# Patient Record
Sex: Male | Born: 2000 | Race: Black or African American | Hispanic: No | Marital: Single | Smoking: Never smoker
Health system: Southern US, Community
[De-identification: ages and names within clinical notes are randomized; demographics above are authoritative.]

## PROBLEM LIST (undated history)

## (undated) HISTORY — PX: OTHER SURGICAL HISTORY: SHX169

---

## 2020-12-17 ENCOUNTER — Emergency Department (HOSPITAL_COMMUNITY)
Admission: EM | Admit: 2020-12-17 | Discharge: 2020-12-17 | Disposition: A | Discharge status: Home / Self Care | Payer: Medicaid - Out of State | Attending: Emergency Medicine | Admitting: Emergency Medicine

## 2020-12-17 ENCOUNTER — Other Ambulatory Visit: Payer: Self-pay

## 2020-12-17 ENCOUNTER — Emergency Department (HOSPITAL_COMMUNITY): Payer: Medicaid - Out of State

## 2020-12-17 ENCOUNTER — Encounter (HOSPITAL_COMMUNITY): Payer: Self-pay | Admitting: *Deleted

## 2020-12-17 DIAGNOSIS — Y92410 Unspecified street and highway as the place of occurrence of the external cause: Secondary | ICD-10-CM | POA: Insufficient documentation

## 2020-12-17 DIAGNOSIS — M79672 Pain in left foot: Secondary | ICD-10-CM | POA: Diagnosis not present
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
## 2020-12-17 NOTE — Discharge Instructions (Addendum)
You have been seen here for left foot pain, I placed you in a postop shoe please wear during the day you may take off at nighttime.  I recommend taking over-the-counter pain medications like ibuprofen and/or Tylenol every 6 as needed.  Please follow dosage and on the back of bottle.  I also recommend applying heat to the area and stretching out the muscles as this will help decrease stiffness and pain.   If pain persists after 1 to 2 weeks please follow-up with orthopedic surgery for further evaluation.  Come back to the emergency department if you develop chest pain, shortness of breath, severe abdominal pain, uncontrolled nausea, vomiting, diarrhea.
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
## 2022-06-28 IMAGING — DX DG FOOT COMPLETE 3+V*L*
3 series · 3 of 3 positions shown · non-contrast
Comparison: None

CLINICAL DATA: Injury to LEFT foot.  History of prior surgery.

EXAM:
LEFT FOOT - COMPLETE 3+ VIEW

[foot ap]
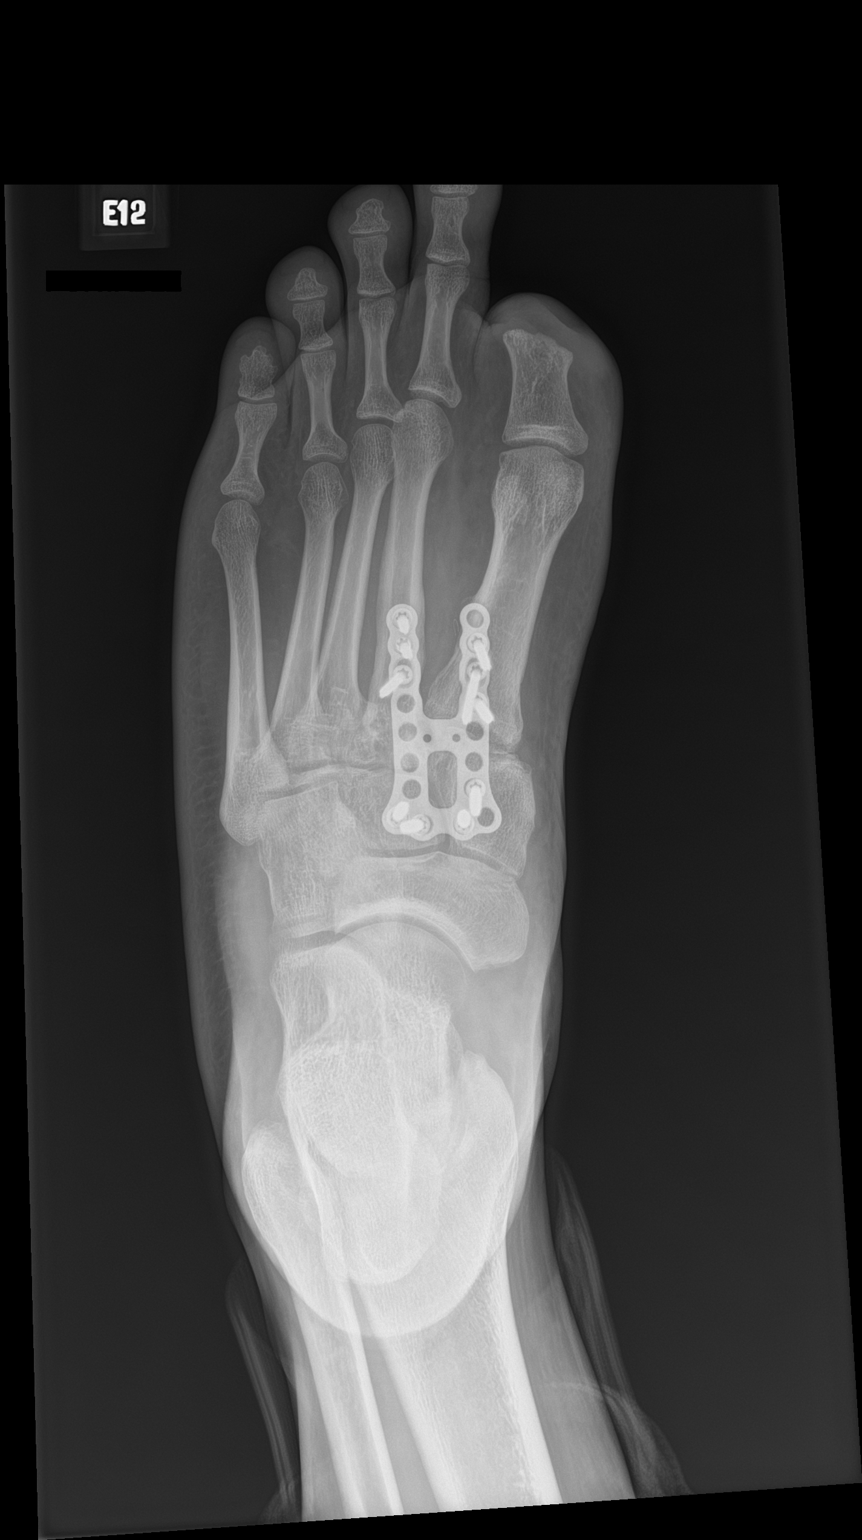

[foot obl]
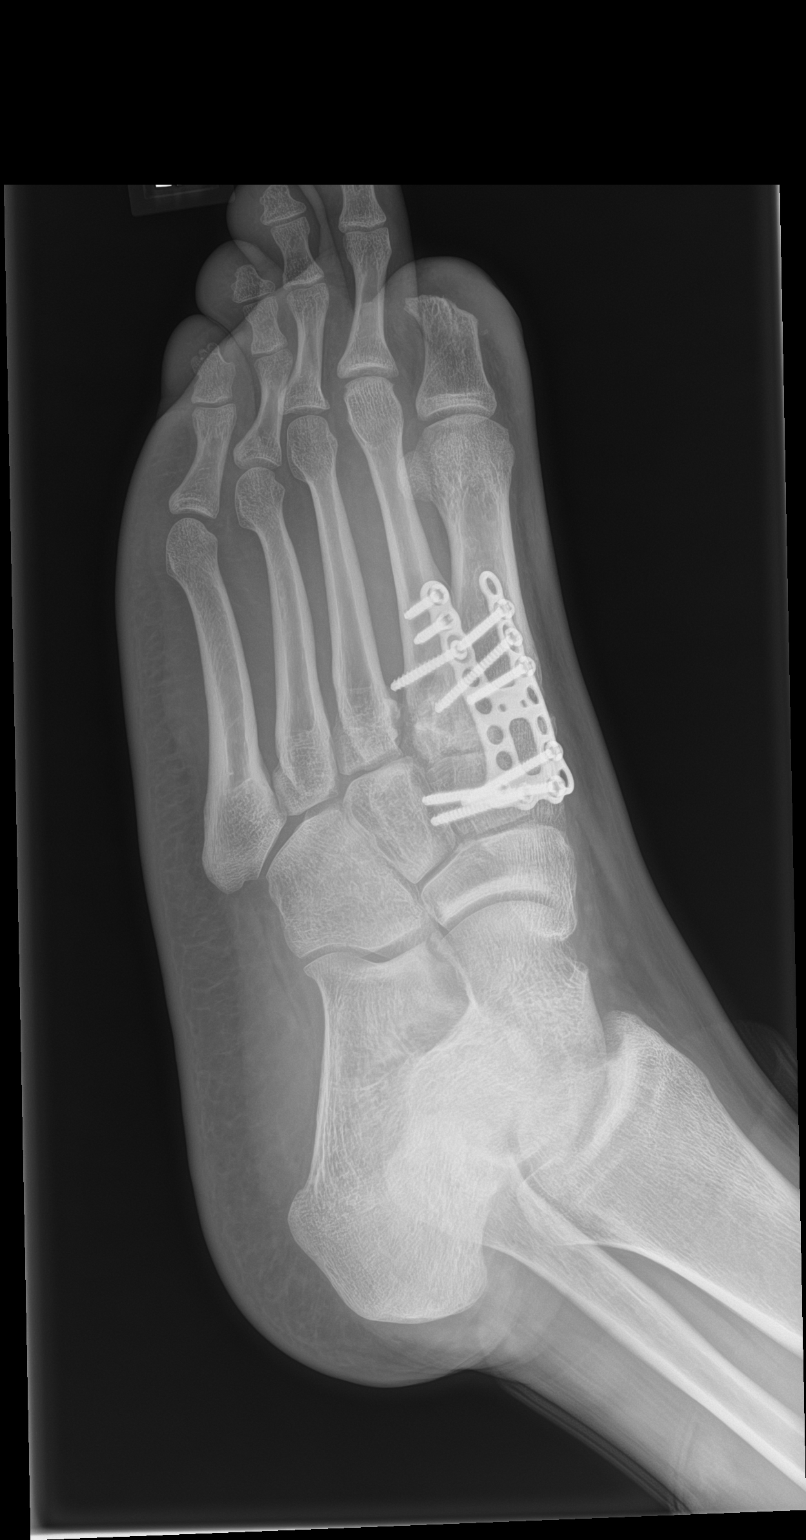

[foot lat]
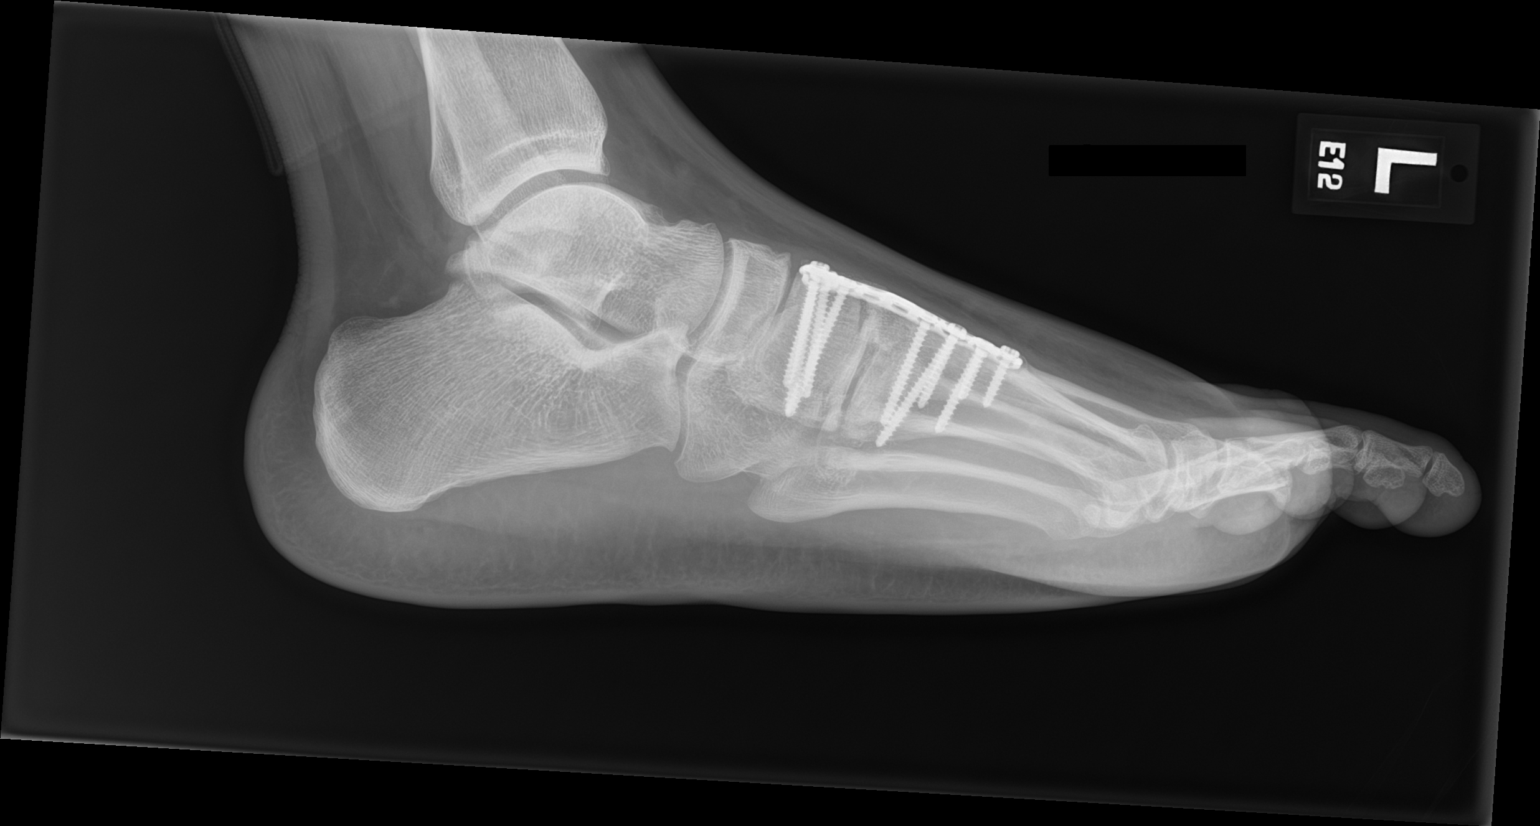

[3 of 3 positions shown; findings below may reference images not displayed]

FINDINGS: Previous fusion across the first and second tarsal metatarsal joints
as well as across the Lisfranc joint.

Hardware appears intact.

Signs of previous amputation of the great toe.

Mild soft tissue swelling about the distal aspect of the stump.

No signs of fracture or acute bony abnormality.
IMPRESSION: Signs of previous partial amputation of the great toe with mild soft
tissue swelling and ORIF of the midfoot as described. No acute bone
or joint abnormality.

## 2022-08-08 ENCOUNTER — Emergency Department (HOSPITAL_COMMUNITY): Payer: Medicaid - Out of State

## 2022-08-08 ENCOUNTER — Emergency Department (HOSPITAL_COMMUNITY)
Admission: EM | Admit: 2022-08-08 | Discharge: 2022-08-08 | Disposition: A | Discharge status: Home / Self Care | Payer: Self-pay | Attending: Student | Admitting: Student

## 2022-08-08 ENCOUNTER — Encounter (HOSPITAL_COMMUNITY): Payer: Self-pay | Admitting: *Deleted

## 2022-08-08 ENCOUNTER — Other Ambulatory Visit: Payer: Self-pay

## 2022-08-08 DIAGNOSIS — X501XXA Overexertion from prolonged static or awkward postures, initial encounter: Secondary | ICD-10-CM | POA: Insufficient documentation

## 2022-08-08 DIAGNOSIS — Y9367 Activity, basketball: Secondary | ICD-10-CM | POA: Insufficient documentation

## 2022-08-08 DIAGNOSIS — S93401A Sprain of unspecified ligament of right ankle, initial encounter: Secondary | ICD-10-CM | POA: Insufficient documentation

## 2022-08-08 MED ORDER — NAPROXEN 375 MG PO TABS: 375.0000 mg | ORAL_TABLET | Freq: Two times a day (BID) | ORAL | 0 refills | Status: DC

## 2022-08-08 MED ORDER — NAPROXEN 250 MG PO TABS
500.0000 mg | ORAL_TABLET | Freq: Once | ORAL | Status: AC
  Administered 2022-08-08: 500 mg via ORAL
  Filled 2022-08-08: qty 2

## 2022-08-08 NOTE — ED Provider Notes (Signed)
[place] Provider Note  CSN: [id number] Arrival date & time: 08/08/22 [id number]  Chief Complaint(s) Ankle Pain  HPI Tyaire Bodine is a 22 y.o. male who presents emergency room for evaluation of right ankle pain.  Patient states that he was playing basketball yesterday and twisted his ankle on the right.  He states that he has pain behind the knee and worse at bilateral malleoli.  Difficulty bearing weight secondary to pain.  Denies numbness, tingling, weakness or other systemic or traumatic complaints.   Past Medical History History reviewed. No pertinent past medical history. There are no problems to display for this patient.  Home Medication(s) Prior to Admission medications   Not on File                                                                                                                                    Past Surgical History Past Surgical History:  Procedure Laterality Date   left foot surgery     Family History History reviewed. No pertinent family history.  Social History Social History   Tobacco Use   Smoking status: Never   Smokeless tobacco: Never  Substance Use Topics   Alcohol use: Never   Drug use: Never   Allergies Penicillins and Vancomycin  Review of Systems Review of Systems  Musculoskeletal:  Positive for arthralgias, joint swelling and myalgias.    Physical Exam Vital Signs  I have reviewed the triage vital signs BP 137/78 (BP Location: Left Arm)   Pulse (!) 56   Temp 98.2 F (36.8 C) (Oral)   Resp 16   Ht 5' 7"$  (1.702 m)   Wt 95.3 kg   SpO2 99%   BMI 32.89 kg/m   Physical Exam Constitutional:      General: He is not in acute distress.    Appearance: Normal appearance.  HENT:     Head: Normocephalic and atraumatic.     Nose: No congestion or rhinorrhea.  Eyes:     General:        Right eye: No discharge.        Left eye: No discharge.     Extraocular Movements: Extraocular  movements intact.     Pupils: Pupils are equal, round, and reactive to light.  Cardiovascular:     Rate and Rhythm: Normal rate and regular rhythm.     Heart sounds: No murmur heard. Pulmonary:     Effort: No respiratory distress.     Breath sounds: No wheezing or rales.  Abdominal:     General: There is no distension.     Tenderness: There is no abdominal tenderness.  Musculoskeletal:        General: Swelling and tenderness present. Normal range of motion.     Cervical back: Normal range of motion.  Skin:    General: Skin is warm and dry.  Neurological:  General: No focal deficit present.     Mental Status: He is alert.     ED Results and Treatments Labs (all labs ordered are listed, but only abnormal results are displayed) Labs Reviewed - No data to display                                                                                                                        Radiology No results found.  Pertinent labs & imaging results that were available during my care of the patient were reviewed by me and considered in my medical decision making (see MDM for details).  Medications Ordered in ED Medications - No data to display                                                                                                                                   Procedures Procedures  (including critical care time)  Medical Decision Making / ED Course   This patient presents to the ED for concern of ankle pain, this involves an extensive number of treatment options, and is a complaint that carries with it a high risk of complications and morbidity.  The differential diagnosis includes fracture, sprain, contusion, hematoma, Maisonneuve injury  MDM: Patient seen emergency room for evaluation of ankle pain.  Physical exam with swelling and tenderness to the lateral malleolus and mild tenderness on the lateral proximal fibula.  X-ray imaging with evidence of ankle sprain with  possible ligamentous injury.  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  He does not have tenderness in this area.  He was placed in a Aircast and given crutches as well as NSAID therapy.  He was instructed to follow-up outpatient with orthopedics and at this time patient does not meet inpatient criteria for admission and he is safe for discharge with outpatient follow-up.   Additional history obtained:  -External records from outside source obtained and reviewed including: Chart review including previous notes, labs, imaging, consultation notes   Imaging Studies ordered: I ordered imaging studies including x-ray ankle, x-ray tib-fib I independently visualized and interpreted imaging. I agree with the radiologist interpretation   Medicines ordered and prescription drug management: No orders of the defined types were placed in this encounter.   -I have reviewed the patients home medicines and have made adjustments as needed  Critical interventions none    Cardiac Monitoring: The patient was maintained on a cardiac monitor.  I personally viewed and interpreted the cardiac monitored which showed an underlying rhythm of: NSR  Social Determinants of Health:  Factors impacting patients care include: none   Reevaluation: After the interventions noted above, I reevaluated the patient and found that they have :improved  Co morbidities that complicate the patient evaluation History reviewed. No pertinent past medical history.    Dispostion: I considered admission for this patient, but he does not meet inpatient criteria for admission he is safe for discharge with outpatient follow-up     Final Clinical Impression(s) / ED Diagnoses Final diagnoses:  None     @PCDICTATION$ @    Mikhayla Phillis, Debe Coder, MD 08/08/22 1056

## 2022-08-08 NOTE — ED Triage Notes (Signed)
Pt c/o pain and swelling to right ankle after twisting his ankle while playing basketball yesterday;  neurovascular intact

## 2022-11-13 ENCOUNTER — Emergency Department (HOSPITAL_COMMUNITY): Payer: Self-pay

## 2022-11-13 ENCOUNTER — Emergency Department (HOSPITAL_COMMUNITY)
Admission: EM | Admit: 2022-11-13 | Discharge: 2022-11-13 | Disposition: A | Discharge status: Home / Self Care | Payer: Self-pay | Attending: Emergency Medicine | Admitting: Emergency Medicine

## 2022-11-13 ENCOUNTER — Other Ambulatory Visit: Payer: Self-pay

## 2022-11-13 ENCOUNTER — Encounter (HOSPITAL_COMMUNITY): Payer: Self-pay | Admitting: Emergency Medicine

## 2022-11-13 DIAGNOSIS — G43909 Migraine, unspecified, not intractable, without status migrainosus: Secondary | ICD-10-CM | POA: Insufficient documentation

## 2022-11-13 DIAGNOSIS — Z20822 Contact with and (suspected) exposure to covid-19: Secondary | ICD-10-CM | POA: Insufficient documentation

## 2022-11-13 LAB — CBC WITH DIFFERENTIAL/PLATELET
Abs Immature Granulocytes: 0.01 10*3/uL (ref 0.00–0.07)
Basophils Absolute: 0.1 10*3/uL (ref 0.0–0.1)
Basophils Relative: 1 %
Eosinophils Absolute: 0.4 10*3/uL (ref 0.0–0.5)
Eosinophils Relative: 4 %
HCT: 48 % (ref 39.0–52.0)
Hemoglobin: 16.6 g/dL (ref 13.0–17.0)
Immature Granulocytes: 0 %
Lymphocytes Relative: 26 %
Lymphs Abs: 2.5 10*3/uL (ref 0.7–4.0)
MCH: 31 pg (ref 26.0–34.0)
MCHC: 34.6 g/dL (ref 30.0–36.0)
MCV: 89.6 fL (ref 80.0–100.0)
Monocytes Absolute: 0.9 10*3/uL (ref 0.1–1.0)
Monocytes Relative: 9 %
Neutro Abs: 5.6 10*3/uL (ref 1.7–7.7)
Neutrophils Relative %: 60 %
Platelets: 283 10*3/uL (ref 150–400)
RBC: 5.36 MIL/uL (ref 4.22–5.81)
RDW: 11.9 % (ref 11.5–15.5)
WBC: 9.4 10*3/uL (ref 4.0–10.5)
nRBC: 0 % (ref 0.0–0.2)

## 2022-11-13 LAB — COMPREHENSIVE METABOLIC PANEL
ALT: 21 U/L (ref 0–44)
AST: 37 U/L (ref 15–41)
Albumin: 5 g/dL (ref 3.5–5.0)
Alkaline Phosphatase: 131 U/L — ABNORMAL HIGH (ref 38–126)
Anion gap: 9 (ref 5–15)
BUN: 14 mg/dL (ref 6–20)
CO2: 24 mmol/L (ref 22–32)
Calcium: 9.6 mg/dL (ref 8.9–10.3)
Chloride: 103 mmol/L (ref 98–111)
Creatinine, Ser: 1.14 mg/dL (ref 0.61–1.24)
GFR, Estimated: >60 mL/min (ref >60)
Glucose, Bld: 89 mg/dL (ref 70–99)
Potassium: 3.8 mmol/L (ref 3.5–5.1)
Sodium: 136 mmol/L (ref 135–145)
Total Bilirubin: 1.7 mg/dL — ABNORMAL HIGH (ref 0.3–1.2)
Total Protein: 8.6 g/dL — ABNORMAL HIGH (ref 6.5–8.1)

## 2022-11-13 LAB — SARS CORONAVIRUS 2 BY RT PCR: SARS Coronavirus 2 by RT PCR: NEGATIVE

## 2022-11-13 LAB — GROUP A STREP BY PCR: Group A Strep by PCR: NOT DETECTED

## 2022-11-13 MED ORDER — DIPHENHYDRAMINE HCL 50 MG/ML IJ SOLN
12.5000 mg | Freq: Once | INTRAMUSCULAR | Status: AC
  Administered 2022-11-13: 12.5 mg via INTRAVENOUS
  Filled 2022-11-13: qty 1

## 2022-11-13 MED ORDER — PROCHLORPERAZINE EDISYLATE 10 MG/2ML IJ SOLN
10.0000 mg | Freq: Once | INTRAMUSCULAR | Status: AC
  Administered 2022-11-13: 10 mg via INTRAVENOUS
  Filled 2022-11-13: qty 2

## 2022-11-13 MED ORDER — KETOROLAC TROMETHAMINE 15 MG/ML IJ SOLN
15.0000 mg | Freq: Once | INTRAMUSCULAR | Status: AC
  Administered 2022-11-13: 15 mg via INTRAVENOUS
  Filled 2022-11-13: qty 1

## 2022-11-13 MED ORDER — SODIUM CHLORIDE 0.9 % IV BOLUS
1000.0000 mL | Freq: Once | INTRAVENOUS | Status: AC
  Administered 2022-11-13: 1000 mL via INTRAVENOUS

## 2022-11-13 NOTE — ED Triage Notes (Signed)
Pt via POV c/o throbbing posterior headache since earlier this morning. No prior hx migraines. No other symptoms reported. No meds PTA

## 2022-11-13 NOTE — ED Notes (Signed)
Patient transported to CT 

## 2022-11-13 NOTE — Discharge Instructions (Signed)
You are seen in the emergency department for headache.  Thankfully workup and imaging was negative for any acute findings.  This is most likely migraine headache given that he had symptomatic improvement with a migraine cocktail.  I would encourage you to establish care with a primary care provider and provide information for [facility].  If you feel that your symptoms return, return to the emergency department for further treatment.  Otherwise advised that he can manage symptoms with over-the-counter interventions such as Tylenol, ibuprofen, Excedrin.

## 2022-11-13 NOTE — ED Provider Notes (Signed)
[place] EMERGENCY DEPARTMENT AT [facility] Provider Note   CSN: [id number] Arrival date & time: 11/13/22  1251     History Chief Complaint  Patient presents with   Headache    Roger Elliott is a 22 y.o. male.  Patient presents emergency department complaints of headaches.  He reports that he woke up this morning with a headache.  Does not really have headaches at baseline.  No prior history of significant migraines or headaches.  Does endorse some associated photosensitivity as well as mild nausea but denies any vomiting.   Headache      Home Medications Prior to Admission medications   Medication Sig Start Date End Date Taking? Authorizing Provider  naproxen (NAPROSYN) 375 MG tablet Take 1 tablet (375 mg total) by mouth 2 (two) times daily. 08/08/22   Kommor, Wyn Forster, MD      Allergies    Penicillins and Vancomycin    Review of Systems   Review of Systems  Neurological:  Positive for headaches.  All other systems reviewed and are negative.   Physical Exam Updated Vital Signs BP 116/83 (BP Location: Right Arm)   Pulse 84   Temp 98.7 F (37.1 C) (Oral)   Resp 16   Ht 5\' 7"  (1.702 m)   Wt 89.8 kg   SpO2 98%   BMI 31.01 kg/m  Physical Exam Vitals and nursing note reviewed.  Constitutional:      General: He is not in acute distress.    Appearance: He is well-developed.  HENT:     Head: Normocephalic and atraumatic.  Eyes:     Extraocular Movements: Extraocular movements intact.     Conjunctiva/sclera: Conjunctivae normal.     Pupils: Pupils are equal, round, and reactive to light.  Cardiovascular:     Rate and Rhythm: Normal rate and regular rhythm.     Heart sounds: No murmur heard. Pulmonary:     Effort: Pulmonary effort is normal. No respiratory distress.     Breath sounds: Normal breath sounds.  Abdominal:     Palpations: Abdomen is soft.     Tenderness: There is no abdominal tenderness.  Musculoskeletal:        General: No swelling.      Cervical back: Normal range of motion and neck supple. No rigidity.  Skin:    General: Skin is warm and dry.     Capillary Refill: Capillary refill takes less than 2 seconds.  Neurological:     Mental Status: He is alert.  Psychiatric:        Mood and Affect: Mood normal.     ED Results / Procedures / Treatments   Labs (all labs ordered are listed, but only abnormal results are displayed) Labs Reviewed  COMPREHENSIVE METABOLIC PANEL - Abnormal; Notable for the following components:      Result Value   Total Protein 8.6 (*)    Alkaline Phosphatase 131 (*)    Total Bilirubin 1.7 (*)    All other components within normal limits  GROUP A STREP BY PCR  SARS CORONAVIRUS 2 BY RT PCR  CBC WITH DIFFERENTIAL/PLATELET    EKG None  Radiology CT Head Wo Contrast  Result Date: 11/13/2022 CLINICAL DATA:  Headache, increasing frequency or severity. EXAM: CT HEAD WITHOUT CONTRAST TECHNIQUE: Contiguous axial images were obtained from the base of the skull through the vertex without intravenous contrast. RADIATION DOSE REDUCTION: This exam was performed according to the departmental dose-optimization program which includes automated exposure control, adjustment  of the mA and/or kV according to patient size and/or use of iterative reconstruction technique. COMPARISON:  None Available. FINDINGS: Brain: No acute intracranial hemorrhage. Gray-white differentiation is preserved. No hydrocephalus or extra-axial collection. No mass effect or midline shift. Vascular: No hyperdense vessel or unexpected calcification. Skull: No calvarial fracture or suspicious bone lesion. Skull base is unremarkable. Sinuses/Orbits: Unremarkable. Other: None. IMPRESSION: Normal head CT. Electronically Signed   By: Orvan Falconer M.D.   On: 11/13/2022 13:40    Procedures Procedures   Medications Ordered in ED Medications  sodium chloride 0.9 % bolus 1,000 mL (0 mLs Intravenous Stopped 11/13/22 1542)  prochlorperazine  (COMPAZINE) injection 10 mg (10 mg Intravenous Given 11/13/22 1411)  diphenhydrAMINE (BENADRYL) injection 12.5 mg (12.5 mg Intravenous Given 11/13/22 1411)  ketorolac (TORADOL) 15 MG/ML injection 15 mg (15 mg Intravenous Given 11/13/22 1412)    ED Course/ Medical Decision Making/ A&P                           Medical Decision Making Amount and/or Complexity of Data Reviewed Labs: ordered. Radiology: ordered.  Risk Prescription drug management.   This patient presents to the ED for concern of headache.  Differential diagnosis includes cluster headache, migraine headache, viral URI, strep pharyngitis   Lab Tests:  I Ordered, and personally interpreted labs.  The pertinent results include: CBC and CMP unremarkable, negative COVID, negative strep   Imaging Studies ordered:  I ordered imaging studies including CT head I independently visualized and interpreted imaging which showed no acute intracranial abnormalities I agree with the radiologist interpretation   Medicines ordered and prescription drug management:  I ordered medication including Compazine, Benadryl, Toradol, fluids for migraine cocktail Reevaluation of the patient after these medicines showed that the patient improved I have reviewed the patients home medicines and have made adjustments as needed   Problem List / ED Course:  Patient presents emergency department complaints of headaches.  Reports that he woke up this morning with a headache.  Typically does not have headaches at baseline no prior history of migraine headaches. States that he feels this headache is focused on the posterior aspect of his head without radiation but has a pulsatile characteristic. Also endorses some photosensitivity. All appears highly consistent with a migraine headache.  Lab and imaging workup initiated which was largely unremarkable without any acute abnormalities noted to account for patient's symptoms.  Will give patient migraine  cocktail and reassess. Position, Benadryl, Toradol and fluids were given to patient.  I reassessed the patient about 45 minutes later after medications were administered and patient reports resolution of his headache.  I this is highly likely this is a migraine headache that he has been experiencing.  Encourage patient to follow with primary care provider/primary care as he may benefit from further evaluation of headaches if symptoms were to return.  Patient is agreeable to treatment plan verbalized understanding all return precautions.  Final Clinical Impression(s) / ED Diagnoses Final diagnoses:  Migraine without status migrainosus, not intractable, unspecified migraine type    Rx / DC Orders ED Discharge Orders     None         Salomon Mast 11/14/22 1631    Bethann Berkshire, MD 11/23/22 1210

## 2023-10-13 ENCOUNTER — Encounter: Payer: Self-pay | Admitting: Emergency Medicine

## 2023-10-13 ENCOUNTER — Ambulatory Visit
Admission: EM | Admit: 2023-10-13 | Discharge: 2023-10-13 | Disposition: A | Discharge status: Home / Self Care | Payer: Self-pay | Attending: Family Medicine | Admitting: Family Medicine

## 2023-10-13 DIAGNOSIS — J069 Acute upper respiratory infection, unspecified: Secondary | ICD-10-CM | POA: Insufficient documentation

## 2023-10-13 DIAGNOSIS — Z113 Encounter for screening for infections with a predominantly sexual mode of transmission: Secondary | ICD-10-CM | POA: Insufficient documentation

## 2023-10-13 LAB — POC COVID19/FLU A&B COMBO
Covid Antigen, POC: NEGATIVE
Influenza A Antigen, POC: NEGATIVE
Influenza B Antigen, POC: NEGATIVE

## 2023-10-13 LAB — POCT RAPID STREP A (OFFICE): Rapid Strep A Screen: NEGATIVE

## 2023-10-13 MED ORDER — FLUTICASONE PROPIONATE 50 MCG/ACT NA SUSP: 1.0000 | Freq: Two times a day (BID) | NASAL | 2 refills | Status: DC

## 2023-10-13 MED ORDER — PROMETHAZINE-DM 6.25-15 MG/5ML PO SYRP: 5.0000 mL | ORAL_SOLUTION | Freq: Four times a day (QID) | ORAL | 0 refills | Status: DC | PRN

## 2023-10-13 NOTE — Discharge Instructions (Signed)
 I have sent in some medications to help with your cold symptoms and you may take DayQuil, NyQuil, ibuprofen and Tylenol as needed as well as using humidifiers, saline sinus rinses and warm teas.  Your COVID flu and strep test were all negative today.  We have also sent out screening for HIV, syphilis, gonorrhea, chlamydia and trichomonas and we will let you know if any of this comes back positive.

## 2023-10-13 NOTE — ED Triage Notes (Signed)
 Sore throat, headache, cough, nasal congestion x 2 days.  States feels weak sometimes.

## 2023-10-13 NOTE — ED Provider Notes (Signed)
 [facility]    CSN: [id number] Arrival date & time: 10/13/23  1649      History   Chief Complaint No chief complaint on file.   HPI Roger Elliott is a 23 y.o. male.   Patient presenting today with 2-day history of sore throat, headache, cough, congestion.  Denies fever, chills, chest pain, shortness of breath, abdominal pain, vomiting, diarrhea.  So far not trying anything over-the-counter for symptoms.  He is also requesting screening for STDs.  No known exposures or current symptoms.    History reviewed. No pertinent past medical history.  There are no active problems to display for this patient.   Past Surgical History:  Procedure Laterality Date   left foot surgery         Home Medications    Prior to Admission medications   Medication Sig Start Date End Date Taking? Authorizing Provider  fluticasone  (FLONASE ) 50 MCG/ACT nasal spray Place 1 spray into both nostrils 2 (two) times daily. 10/13/23  Yes Corbin Dess, PA-C  promethazine -dextromethorphan (PROMETHAZINE -DM) 6.25-15 MG/5ML syrup Take 5 mLs by mouth 4 (four) times daily as needed. 10/13/23  Yes Corbin Dess, PA-C    Family History History reviewed. No pertinent family history.  Social History Social History   Tobacco Use   Smoking status: Never   Smokeless tobacco: Never  Substance Use Topics   Alcohol use: Never   Drug use: Never     Allergies   Penicillins and Vancomycin   Review of Systems Review of Systems HPI  Physical Exam Triage Vital Signs ED Triage Vitals  Encounter Vitals Group     BP 10/13/23 1656 122/68     Systolic BP Percentile --      Diastolic BP Percentile --      Pulse Rate 10/13/23 1656 70     Resp 10/13/23 1656 18     Temp 10/13/23 1656 98.5 F (36.9 C)     Temp Source 10/13/23 1656 Oral     SpO2 10/13/23 1656 95 %     Weight --      Height --      Head Circumference --      Peak Flow --      Pain Score 10/13/23 1658 10      Pain Loc --      Pain Education --      Exclude from Growth Chart --    No data found.  Updated Vital Signs BP 122/68 (BP Location: Right Arm)   Pulse 70   Temp 98.5 F (36.9 C) (Oral)   Resp 18   SpO2 95%   Visual Acuity Right Eye Distance:   Left Eye Distance:   Bilateral Distance:    Right Eye Near:   Left Eye Near:    Bilateral Near:     Physical Exam Vitals and nursing note reviewed.  Constitutional:      Appearance: He is well-developed.  HENT:     Head: Atraumatic.     Right Ear: External ear normal.     Left Ear: External ear normal.     Nose: Rhinorrhea present.     Mouth/Throat:     Pharynx: Posterior oropharyngeal erythema present. No oropharyngeal exudate.  Eyes:     Conjunctiva/sclera: Conjunctivae normal.     Pupils: Pupils are equal, round, and reactive to light.  Cardiovascular:     Rate and Rhythm: Normal rate and regular rhythm.  Pulmonary:     Effort: Pulmonary effort  is normal. No respiratory distress.     Breath sounds: No wheezing or rales.  Genitourinary:    Comments: GU exam deferred, self swab performed Musculoskeletal:        General: Normal range of motion.     Cervical back: Normal range of motion and neck supple.  Lymphadenopathy:     Cervical: No cervical adenopathy.  Skin:    General: Skin is warm and dry.  Neurological:     Mental Status: He is alert and oriented to person, place, and time.  Psychiatric:        Behavior: Behavior normal.      UC Treatments / Results  Labs (all labs ordered are listed, but only abnormal results are displayed) Labs Reviewed  POC COVID19/FLU A&B COMBO - Normal  POCT RAPID STREP A (OFFICE) - Normal  HIV ANTIBODY (ROUTINE TESTING W REFLEX)  RPR  CYTOLOGY, (ORAL, ANAL, URETHRAL) ANCILLARY ONLY    EKG   Radiology No results found.  Procedures Procedures (including critical care time)  Medications Ordered in UC Medications - No data to display  Initial Impression / Assessment  and Plan / UC Course  I have reviewed the triage vital signs and the nursing notes.  Pertinent labs & imaging results that were available during my care of the patient were reviewed by me and considered in my medical decision making (see chart for details).     Vital signs and exam overall reassuring today, suspicious for viral respiratory infection.  COVID, flu and strep testing all negative in clinic.  He is requesting STD screening today, HIV and syphilis labs pending as well as cytology swab for gonorrhea chlamydia and trichomonas.  Treat based on results.  Final Clinical Impressions(s) / UC Diagnoses   Final diagnoses:  Screening examination for STI  Viral URI with cough     Discharge Instructions      I have sent in some medications to help with your cold symptoms and you may take DayQuil, NyQuil, ibuprofen and Tylenol as needed as well as using humidifiers, saline sinus rinses and warm teas.  Your COVID flu and strep test were all negative today.  We have also sent out screening for HIV, syphilis, gonorrhea, chlamydia and trichomonas and we will let you know if any of this comes back positive.    ED Prescriptions     Medication Sig Dispense Auth. Provider   promethazine -dextromethorphan (PROMETHAZINE -DM) 6.25-15 MG/5ML syrup Take 5 mLs by mouth 4 (four) times daily as needed. 100 mL Corbin Dess, PA-C   fluticasone  (FLONASE ) 50 MCG/ACT nasal spray Place 1 spray into both nostrils 2 (two) times daily. 16 g Corbin Dess, [place]      PDMP not reviewed this encounter.   Corbin Dess, [place] 10/13/23 1732

## 2023-10-14 LAB — CYTOLOGY, (ORAL, ANAL, URETHRAL) ANCILLARY ONLY
Chlamydia: NEGATIVE
Comment: NEGATIVE
Comment: NEGATIVE
Comment: NORMAL
Neisseria Gonorrhea: NEGATIVE
Trichomonas: NEGATIVE

## 2023-10-16 LAB — HIV ANTIBODY (ROUTINE TESTING W REFLEX): HIV Screen 4th Generation wRfx: NONREACTIVE

## 2023-10-16 LAB — RPR: RPR Ser Ql: NONREACTIVE

## 2023-12-28 ENCOUNTER — Ambulatory Visit
Admission: EM | Admit: 2023-12-28 | Discharge: 2023-12-28 | Disposition: A | Discharge status: Home / Self Care | Payer: Self-pay | Attending: Family Medicine | Admitting: Family Medicine

## 2023-12-28 DIAGNOSIS — J0100 Acute maxillary sinusitis, unspecified: Secondary | ICD-10-CM

## 2023-12-28 DIAGNOSIS — U071 COVID-19: Secondary | ICD-10-CM

## 2023-12-28 MED ORDER — PROMETHAZINE-DM 6.25-15 MG/5ML PO SYRP: 5.0000 mL | ORAL_SOLUTION | Freq: Four times a day (QID) | ORAL | 0 refills | Status: AC | PRN

## 2023-12-28 MED ORDER — AZITHROMYCIN 250 MG PO TABS
ORAL_TABLET | ORAL | 0 refills | Status: AC
Start: 2023-12-28 — End: ?

## 2023-12-28 MED ORDER — FLUTICASONE PROPIONATE 50 MCG/ACT NA SUSP: 1.0000 | Freq: Two times a day (BID) | NASAL | 2 refills | Status: AC

## 2023-12-28 NOTE — ED Triage Notes (Signed)
 Sinus pain and pressure, ear fullness, dizziness, cough, sore throat x 1 week. Pt tested positive for covid last Monday. Taking tylenol.

## 2023-12-31 NOTE — ED Provider Notes (Signed)
 [facility]    CSN: [id number] Arrival date & time: 12/28/23  1518      History   Chief Complaint Chief Complaint  Patient presents with   Facial Pain   Ear Fullness    HPI Roger Elliott is a 23 y.o. male.   Patient presenting today with ongoing nasal congestion, sinus pain and pressure, ear fullness, dizziness, cough, sore throat for the past week.  Tested positive for COVID last Monday and states symptoms the past 2 days have become worse again and having fevers.  Denies chest pain, shortness of breath, abdominal pain, vomiting, diarrhea.  So far trying Tylenol with minimal relief.    History reviewed. No pertinent past medical history.  There are no active problems to display for this patient.   Past Surgical History:  Procedure Laterality Date   left foot surgery         Home Medications    Prior to Admission medications   Medication Sig Start Date End Date Taking? Authorizing Provider  azithromycin  (ZITHROMAX ) 250 MG tablet Take first 2 tablets together, then 1 every day until finished. 12/28/23  Yes Stuart Vernell Norris, PA-C  fluticasone  (FLONASE ) 50 MCG/ACT nasal spray Place 1 spray into both nostrils 2 (two) times daily. 12/28/23   Stuart Vernell Norris, PA-C  promethazine -dextromethorphan (PROMETHAZINE -DM) 6.25-15 MG/5ML syrup Take 5 mLs by mouth 4 (four) times daily as needed. 12/28/23   Stuart Vernell Norris, PA-C    Family History History reviewed. No pertinent family history.  Social History Social History   Tobacco Use   Smoking status: Never   Smokeless tobacco: Never  Substance Use Topics   Alcohol use: Never   Drug use: Never     Allergies   Penicillins and Vancomycin   Review of Systems Review of Systems PER HPI  Physical Exam Triage Vital Signs ED Triage Vitals  Encounter Vitals Group     BP 12/28/23 1530 116/68     Girls Systolic BP Percentile --      Girls Diastolic BP Percentile --      Boys Systolic BP  Percentile --      Boys Diastolic BP Percentile --      Pulse Rate 12/28/23 1530 (!) 58     Resp 12/28/23 1530 18     Temp 12/28/23 1530 97.7 F (36.5 C)     Temp Source 12/28/23 1530 Oral     SpO2 12/28/23 1530 97 %     Weight --      Height --      Head Circumference --      Peak Flow --      Pain Score 12/28/23 1531 8     Pain Loc --      Pain Education --      Exclude from Growth Chart --    No data found.  Updated Vital Signs BP 116/68 (BP Location: Right Arm)   Pulse (!) 58   Temp 97.7 F (36.5 C) (Oral)   Resp 18   SpO2 97%   Visual Acuity Right Eye Distance:   Left Eye Distance:   Bilateral Distance:    Right Eye Near:   Left Eye Near:    Bilateral Near:     Physical Exam Vitals and nursing note reviewed.  Constitutional:      Appearance: He is well-developed.  HENT:     Head: Atraumatic.     Right Ear: Tympanic membrane and external ear normal.  Left Ear: Tympanic membrane and external ear normal.     Nose: Congestion present.     Mouth/Throat:     Mouth: Mucous membranes are moist.     Pharynx: Posterior oropharyngeal erythema present. No oropharyngeal exudate.  Eyes:     Conjunctiva/sclera: Conjunctivae normal.     Pupils: Pupils are equal, round, and reactive to light.  Cardiovascular:     Rate and Rhythm: Normal rate and regular rhythm.  Pulmonary:     Effort: Pulmonary effort is normal. No respiratory distress.     Breath sounds: No wheezing or rales.  Musculoskeletal:        General: Normal range of motion.     Cervical back: Normal range of motion and neck supple.  Lymphadenopathy:     Cervical: No cervical adenopathy.  Skin:    General: Skin is warm and dry.  Neurological:     Mental Status: He is alert and oriented to person, place, and time.     Motor: No weakness.     Gait: Gait normal.  Psychiatric:        Mood and Affect: Mood normal.        Behavior: Behavior normal.        Thought Content: Thought content normal.         Judgment: Judgment normal.      UC Treatments / Results  Labs (all labs ordered are listed, but only abnormal results are displayed) Labs Reviewed - No data to display  EKG   Radiology No results found.  Procedures Procedures (including critical care time)  Medications Ordered in UC Medications - No data to display  Initial Impression / Assessment and Plan / UC Course  I have reviewed the triage vital signs and the nursing notes.  Pertinent labs & imaging results that were available during my care of the patient were reviewed by me and considered in my medical decision making (see chart for details).     Progressively worsening symptoms 1 week post COVID diagnosis.  Possibly secondary bacterial sinusitis following COVID infection, will treat with Zithromax , Phenergan  DM, Flonase , supportive over-the-counter medications and home care.  Return for worsening symptoms.  Final Clinical Impressions(s) / UC Diagnoses   Final diagnoses:  COVID-19  Acute non-recurrent maxillary sinusitis   Discharge Instructions   None    ED Prescriptions     Medication Sig Dispense Auth. Provider   fluticasone  (FLONASE ) 50 MCG/ACT nasal spray Place 1 spray into both nostrils 2 (two) times daily. 16 g Stuart Vernell Norris, PA-C   promethazine -dextromethorphan (PROMETHAZINE -DM) 6.25-15 MG/5ML syrup Take 5 mLs by mouth 4 (four) times daily as needed. 100 mL Stuart Vernell Norris, PA-C   azithromycin  (ZITHROMAX ) 250 MG tablet Take first 2 tablets together, then 1 every day until finished. 6 tablet Stuart Vernell Norris, [place]      PDMP not reviewed this encounter.   Stuart Vernell [place], [place] 12/31/23 [phone]

## 2024-02-18 ENCOUNTER — Ambulatory Visit
Admission: EM | Admit: 2024-02-18 | Discharge: 2024-02-18 | Disposition: A | Discharge status: Home / Self Care | Payer: Self-pay | Attending: Family Medicine | Admitting: Family Medicine

## 2024-02-18 DIAGNOSIS — R197 Diarrhea, unspecified: Secondary | ICD-10-CM

## 2024-02-18 DIAGNOSIS — R112 Nausea with vomiting, unspecified: Secondary | ICD-10-CM

## 2024-02-18 LAB — POC SOFIA SARS ANTIGEN FIA: SARS Coronavirus 2 Ag: NEGATIVE

## 2024-02-18 MED ORDER — ONDANSETRON 4 MG PO TBDP: 4.0000 mg | ORAL_TABLET | Freq: Three times a day (TID) | ORAL | 0 refills | Status: AC | PRN

## 2024-02-18 MED ORDER — ONDANSETRON 4 MG PO TBDP
4.0000 mg | ORAL_TABLET | Freq: Once | ORAL | Status: AC
  Administered 2024-02-18: 4 mg via ORAL

## 2024-02-18 NOTE — ED Provider Notes (Signed)
 [facility]    CSN: [id number] Arrival date & time: 02/18/24  1939      History   Chief Complaint No chief complaint on file.   HPI Roger Elliott is a 23 y.o. male.   Patient presenting today with new onset nausea, diarrhea, vomiting, sore throat, dry mouth that started this morning.  States food does not taste the same today.  Denies fever, chills, cough, congestion, chest pain, shortness of breath, abdominal pain.  Trying Pepto-Bismol and ginger ale with no relief.  No new medications or foods recently, new sick contacts, recent travel outside the country.    History reviewed. No pertinent past medical history.  There are no active problems to display for this patient.   Past Surgical History:  Procedure Laterality Date   left foot surgery         Home Medications    Prior to Admission medications   Medication Sig Start Date End Date Taking? Authorizing Provider  ondansetron  (ZOFRAN -ODT) 4 MG disintegrating tablet Take 1 tablet (4 mg total) by mouth every 8 (eight) hours as needed for nausea or vomiting. 02/18/24  Yes Stuart Vernell Norris, PA-C  azithromycin  (ZITHROMAX ) 250 MG tablet Take first 2 tablets together, then 1 every day until finished. 12/28/23   Stuart Vernell Norris, PA-C  fluticasone  (FLONASE ) 50 MCG/ACT nasal spray Place 1 spray into both nostrils 2 (two) times daily. 12/28/23   Stuart Vernell Norris, PA-C  promethazine -dextromethorphan (PROMETHAZINE -DM) 6.25-15 MG/5ML syrup Take 5 mLs by mouth 4 (four) times daily as needed. 12/28/23   Stuart Vernell Norris, PA-C    Family History History reviewed. No pertinent family history.  Social History Social History   Tobacco Use   Smoking status: Never   Smokeless tobacco: Never  Substance Use Topics   Alcohol use: Never   Drug use: Never     Allergies   Penicillins and Vancomycin   Review of Systems Review of Systems Per HPI  Physical Exam Triage Vital Signs ED Triage Vitals   Encounter Vitals Group     BP 02/18/24 1944 108/70     Girls Systolic BP Percentile --      Girls Diastolic BP Percentile --      Boys Systolic BP Percentile --      Boys Diastolic BP Percentile --      Pulse Rate 02/18/24 1944 (!) 56     Resp 02/18/24 1944 20     Temp 02/18/24 1944 98.6 F (37 C)     Temp Source 02/18/24 1944 Oral     SpO2 02/18/24 1944 97 %     Weight --      Height --      Head Circumference --      Peak Flow --      Pain Score 02/18/24 1947 8     Pain Loc --      Pain Education --      Exclude from Growth Chart --    No data found.  Updated Vital Signs BP 108/70 (BP Location: Right Arm)   Pulse (!) 56   Temp 98.6 F (37 C) (Oral)   Resp 20   SpO2 97%   Visual Acuity Right Eye Distance:   Left Eye Distance:   Bilateral Distance:    Right Eye Near:   Left Eye Near:    Bilateral Near:     Physical Exam Vitals and nursing note reviewed.  Constitutional:      Appearance: Normal appearance.  HENT:     Head: Atraumatic.     Mouth/Throat:     Mouth: Mucous membranes are moist.     Pharynx: Oropharynx is clear.  Eyes:     Extraocular Movements: Extraocular movements intact.     Conjunctiva/sclera: Conjunctivae normal.  Cardiovascular:     Rate and Rhythm: Normal rate and regular rhythm.  Pulmonary:     Effort: Pulmonary effort is normal.     Breath sounds: Normal breath sounds.  Abdominal:     General: Bowel sounds are normal. There is no distension.     Palpations: Abdomen is soft.     Tenderness: There is no abdominal tenderness. There is no guarding.  Musculoskeletal:        General: Normal range of motion.     Cervical back: Normal range of motion and neck supple.  Skin:    General: Skin is warm and dry.  Neurological:     General: No focal deficit present.     Mental Status: He is oriented to person, place, and time.  Psychiatric:        Mood and Affect: Mood normal.        Thought Content: Thought content normal.         Judgment: Judgment normal.      UC Treatments / Results  Labs (all labs ordered are listed, but only abnormal results are displayed) Labs Reviewed  POC SOFIA SARS ANTIGEN FIA    EKG   Radiology No results found.  Procedures Procedures (including critical care time)  Medications Ordered in UC Medications  ondansetron  (ZOFRAN -ODT) disintegrating tablet 4 mg (4 mg Oral Given 02/18/24 1957)    Initial Impression / Assessment and Plan / UC Course  I have reviewed the triage vital signs and the nursing notes.  Pertinent labs & imaging results that were available during my care of the patient were reviewed by me and considered in my medical decision making (see chart for details).     Zofran  given for active nausea in clinic, vitals and exam overall reassuring with no red flag findings.  Rapid COVID-negative.  Suspect viral GI illness.  Treat with Zofran , BRAT diet, fluids, rest.  Note given. Final Clinical Impressions(s) / UC Diagnoses   Final diagnoses:  Nausea vomiting and diarrhea   Discharge Instructions   None    ED Prescriptions     Medication Sig Dispense Auth. Provider   ondansetron  (ZOFRAN -ODT) 4 MG disintegrating tablet Take 1 tablet (4 mg total) by mouth every 8 (eight) hours as needed for nausea or vomiting. 20 tablet Stuart Vernell Norris, [place]      PDMP not reviewed this encounter.   Stuart Vernell Norris, [place] 02/18/24 2005

## 2024-02-18 NOTE — ED Triage Notes (Signed)
 Pt reports dry mouth, nausea and vomiting onset today.
